# Patient Record
Sex: Male | Born: 1965 | Race: White | Hispanic: No | Marital: Married | Smoking: Never smoker
Health system: Southern US, Community
[De-identification: ages and names within clinical notes are randomized; demographics above are authoritative.]

## PROBLEM LIST (undated history)

## (undated) DIAGNOSIS — K219 Gastro-esophageal reflux disease without esophagitis: Secondary | ICD-10-CM

## (undated) DIAGNOSIS — R0602 Shortness of breath: Secondary | ICD-10-CM

## (undated) DIAGNOSIS — T733XXA Exhaustion due to excessive exertion, initial encounter: Secondary | ICD-10-CM

## (undated) HISTORY — PX: ANKLE SURGERY: SHX546

## (undated) HISTORY — DX: Gastro-esophageal reflux disease without esophagitis: K21.9

## (undated) HISTORY — DX: Exhaustion due to excessive exertion, initial encounter: T73.3XXA

## (undated) HISTORY — DX: Shortness of breath: R06.02

## (undated) HISTORY — PX: VASECTOMY: SHX75

---

## 2013-04-12 ENCOUNTER — Encounter (HOSPITAL_COMMUNITY): Payer: Self-pay | Admitting: Emergency Medicine

## 2013-04-12 ENCOUNTER — Emergency Department (HOSPITAL_COMMUNITY)
Admission: EM | Admit: 2013-04-12 | Discharge: 2013-04-12 | Disposition: A | Discharge status: Home / Self Care | Payer: PRIVATE HEALTH INSURANCE | Source: Home / Self Care

## 2013-04-12 DIAGNOSIS — R350 Frequency of micturition: Secondary | ICD-10-CM

## 2013-04-12 DIAGNOSIS — N419 Inflammatory disease of prostate, unspecified: Secondary | ICD-10-CM

## 2013-04-12 LAB — POCT URINALYSIS DIP (DEVICE)
Glucose, UA: NEGATIVE mg/dL
Ketones, ur: NEGATIVE mg/dL
Specific Gravity, Urine: >=1.03 (ref 1.005–1.030)
Urobilinogen, UA: 0.2 mg/dL (ref 0.0–1.0)

## 2013-04-12 LAB — HEMOGLOBIN A1C: Mean Plasma Glucose: 117 mg/dL — ABNORMAL HIGH (ref <117)

## 2013-04-12 LAB — GLUCOSE, CAPILLARY: Glucose-Capillary: 97 mg/dL (ref 70–99)

## 2013-04-12 MED ORDER — DOXYCYCLINE HYCLATE 100 MG PO CAPS: 100.0000 mg | ORAL_CAPSULE | Freq: Two times a day (BID) | ORAL | Status: DC

## 2013-04-12 NOTE — ED Provider Notes (Signed)
CSN: [id number]     Arrival date & time 04/12/13  0840 History   First MD Initiated Contact with Patient 04/12/13 0932     Chief Complaint  Patient presents with  . 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 He was able to initiate a trial of Cipro to which she started 8 days ago in addition to Flomax. During the day he urinates approximately once per hour with a normal state he did not has a strain. During the evening and night he will have to  Void proximately 12 times. there is some difference in the characteristic of the stream however, there is hesitancy, poor strain in the perceived necessity to strain to void his bladder. He denies urethral pain or dysuria. Denies fever, chills, abdominal pain, nausea, vomiting.    History reviewed. No pertinent past medical history. Past Surgical History  Procedure Laterality Date  . Ankle surgery     No family history on file. History  Substance Use Topics  . Smoking status: Never Smoker   . Smokeless tobacco: Not on file  . Alcohol Use: Yes    Review of Systems  Constitutional: Positive for fatigue. Negative for fever and activity change.  HENT: Negative.   Respiratory: Negative.   Gastrointestinal: Negative.   Genitourinary: Positive for urgency, frequency and difficulty urinating. Negative for dysuria, hematuria, flank pain, decreased urine volume, discharge, penile swelling, scrotal swelling, enuresis, penile pain and testicular pain.  Musculoskeletal: Negative.   Skin: Negative.   Neurological: Negative.     Allergies  Penicillins  Home Medications   Current Outpatient Rx  Name  Route  Sig  Dispense  Refill  . Ciprofloxacin (CIPRO PO)   Oral   Take by mouth.         . Phenazopyridine HCl (PYRIDIUM PO)  Oral   Take by mouth.         . tamsulosin (FLOMAX) 0.4 MG CAPS capsule   Oral   Take by mouth.         . doxycycline (VIBRAMYCIN) 100 MG capsule   Oral   Take 1 capsule (100 mg total) by mouth 2 (two) times daily.   28 capsule   0    BP 151/99  Pulse 71  Temp(Src) 98.1 F (36.7 C) (Oral)  Resp 18  SpO2 98% Physical Exam  Nursing note and vitals reviewed. Constitutional: He is oriented to person, place, and time. He appears well-developed and well-nourished. No distress.  Eyes: EOM are normal.  Neck: Normal range of motion. Neck supple.  Cardiovascular: Normal rate.   Pulmonary/Chest: Effort normal. No respiratory distress.  Genitourinary:  Prostate exam. Normal anus and sphincter tone. No lesions. Prostate is of normal size with normal sulcus. Palpation reveals a soft and boggy prostate. And also produces strong sensation to urinate as well as lower urinary tract/bladder discomfort. Denies actual pain.  Musculoskeletal: He exhibits no edema.  Neurological: He is alert and oriented to person, place, and time. He exhibits normal muscle tone.  Skin: Skin is warm and dry. No rash noted.  Psychiatric: He has a normal mood and affect.    ED Course  Procedures (including critical care time) Labs Review Labs Reviewed  POCT URINALYSIS DIP (DEVICE) - Abnormal; Notable for the following:    Hgb urine dipstick TRACE (*)    All other components within normal  limits  URINE CULTURE  HEMOGLOBIN A1C   Imaging Review No results found.  MDM   1. Prostatitis   2. Urinary frequency    Doxycycline 100 mg twice a day to the Cipro. He will complete the Cipro for 3 weeks. Continue the Flomax it seemed to help. We will culture a new urine Patient is requesting an A1c and a BNP. were drawn these in preparation for his internal medicine visit coming up in one to 2 weeks.    Hayden Rasmussen, NP 04/12/13 1026

## 2013-04-12 NOTE — ED Notes (Signed)
Not ready for discharge, labs pending

## 2013-04-12 NOTE — ED Provider Notes (Signed)
Medical screening examination/treatment/procedure(s) were performed by a resident physician or non-physician practitioner and as the supervising physician I was immediately available for consultation/collaboration.  Lemya Greenwell, MD   Liliana Brentlinger S Bernhardt Riemenschneider, MD 04/12/13 1658 

## 2013-04-12 NOTE — ED Notes (Signed)
Reports symptoms for 3 weeks, reports urinary frequency.  Patient has been on cipro for 10 days, flomax for one week and is taking pyridium.  Patient reports no improvement

## 2013-04-13 LAB — URINE CULTURE
Colony Count: NO GROWTH
Culture: NO GROWTH

## 2013-04-14 ENCOUNTER — Telehealth (HOSPITAL_COMMUNITY): Payer: Self-pay | Admitting: *Deleted

## 2013-04-14 NOTE — ED Notes (Signed)
Urine culture: No growth, Hgb A1C 5.7 H, mean glucose 117.  I asked Lawrence Lopez if I needed to call the results. He said yes.  I called pt. Pt. verified x 2 and given results.  Pt. asked if we did an I stat 8 and I said no.  He asked the range on AIC. I told him it just says <5.7 % is normal, but I have been told 6.5 and above is considered diabetes.   Lawrence Lopez 04/14/2013

## 2015-12-06 DIAGNOSIS — T733XXA Exhaustion due to excessive exertion, initial encounter: Secondary | ICD-10-CM | POA: Insufficient documentation

## 2015-12-06 DIAGNOSIS — R0602 Shortness of breath: Secondary | ICD-10-CM | POA: Insufficient documentation

## 2015-12-06 DIAGNOSIS — K219 Gastro-esophageal reflux disease without esophagitis: Secondary | ICD-10-CM | POA: Insufficient documentation

## 2015-12-09 ENCOUNTER — Encounter: Payer: Self-pay | Admitting: Cardiovascular Disease

## 2015-12-09 ENCOUNTER — Ambulatory Visit (INDEPENDENT_AMBULATORY_CARE_PROVIDER_SITE_OTHER): Payer: PRIVATE HEALTH INSURANCE | Admitting: Cardiovascular Disease

## 2015-12-09 VITALS — BP 130/88 | HR 72 | Ht 71.0 in | Wt 214.4 lb

## 2015-12-09 DIAGNOSIS — R0602 Shortness of breath: Secondary | ICD-10-CM

## 2015-12-09 NOTE — Progress Notes (Signed)
Cardiology Office Note Date:  12/09/2015   ID:  Lawrence Lopez, DOB [birth year], MRN [id number]  PCP:  Lawrence Ran, MD Cardiologist:  Lawrence Bollman, MD    Chief Complaint  Patient presents with  . New Patient (Initial Visit)    worsening DOE    History of Present Illness: Lawrence Lopez is a 50 y.o. male who presents for evaluation of shortness of breath. 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 He has been active and healthy over the years, but over the last few months he has been limited by shortness of breath. He has previously been able to exercise without limitation. He initially felt he was 'out of shape' and attributed symptoms to weight gain. However, even with regular exercise and weight back to baseline, he continues to have dyspnea with moderate exertion such as jogging at a slow pace. He denies resting symptoms and also denies orthopnea, PND, or edema. He's had no chest pain or pressure. Denies wheezing, cough, or reflux symptoms. No family history of premature CAD. He has a history of HTN, previously controlled with exercise and diet, but more recently has required multidrug antihypertensive Rx.    Past Medical History  Diagnosis Date  . SOB (shortness of breath) on exertion   . Fatigue due to excessive exertion   . GERD (gastroesophageal reflux disease)     Past Surgical History  Procedure Laterality Date  . Ankle surgery    . Vasectomy      AGE 50    Current Outpatient Prescriptions  Medication Sig Dispense Refill  . amLODipine (NORVASC) 10 MG tablet Take 10 mg by mouth daily.  3  . atorvastatin (LIPITOR) 80 MG tablet Take 40 mg by mouth daily.  4  . chlorthalidone (HYGROTON) 25 MG tablet Take 25 mg by mouth daily.    . valsartan (DIOVAN) 320 MG tablet Take 320 mg by mouth daily.  2   No current facility-administered medications for this visit.    Allergies:   Penicillins   Social History:  The patient  reports that he has never smoked. He does not  have any smokeless tobacco history on file. He reports that he drinks alcohol. He reports that he does not use illicit drugs.   Family History:  The patient's  family history includes Alcoholism in his brother; COPD in his paternal grandfather and paternal grandmother; Cancer - Lung in his paternal grandfather and paternal grandmother; Cancer - Ovarian in his mother; Diabetes type II in his brother and father; Heart attack (age of onset: 56) in his maternal aunt; Heart failure in his maternal grandmother; Hypertension in his brother, brother, father, maternal grandmother, and mother; Kidney disease in his maternal grandmother; Renal Disease in his mother; Squamous cell carcinoma (age of onset: 90) in his father.    ROS:  Please see the history of present illness.   All other systems are reviewed and negative.    PHYSICAL EXAM: VS:  BP 130/88 mmHg  Pulse 72  Ht  (1.803 m)  Wt 214 lb 6.4 oz (97.251 kg)  BMI 29.92 kg/m2 , BMI Body mass index is 29.92 kg/(m^2). GEN: Well nourished, well developed, in no acute distress HEENT: normal Neck: no JVD, no masses. No carotid bruits Cardiac: RRR without murmur or gallop                Respiratory:  clear to auscultation bilaterally, normal work of breathing GI: soft, nontender, nondistended, + BS MS: no deformity or  atrophy Ext: no pretibial edema, pedal pulses 2+= bilaterally Skin: warm and dry, no rash Neuro:  Strength and sensation are intact Psych: euthymic mood, full affect  EKG:  EKG is not ordered today. [facility] and shows NSR with possible age-indeterminate inferior MI likely normal variant  Recent Labs: No results found for requested labs within last 365 days.   Lipid Panel  No results found for: CHOL, TRIG, HDL, CHOLHDL, VLDL, LDLCALC, LDLDIRECT    Wt Readings from Last 3 Encounters:  12/09/15 214 lb 6.4 oz (97.251 kg)    ASSESSMENT AND PLAN: 50 yo male with hypertension, now presenting with  progressive exertional dyspnea, NYHA II symptoms.  He has been previously active and no history of cardiac problems. He has a normal cardiac exam. Considering his history of hypertension, I've recommended a 2D echo to evaluate for the possibility of systolic or diastolic dysfunction. I've also recommended an exercise Myoview stress test to evaluate dyspnea as an anginal equivalent. The patient's recent lab and radiographic studies are reviewed with a normal d-dimer, BNP, and chest XRay. Will assess exercise capacity, myocardial perfusion, systolic and diastolic parameters, and valvular function with the echo and nuclear scans. Further testing only if significant abnormalities found.   Current medicines are reviewed with the patient today.  The patient does not have concerns regarding medicines.  Labs/ tests ordered today include:  No orders of the defined types were placed in this encounter.   Disposition:   FU pending results of testing  Signed, Lawrence Bollmanooper, Kamau Weatherall, MD  12/09/2015 12:19 PM    [facility] [address], [place], [place]  [address] Phone: [phone]; Fax: [phone]

## 2015-12-09 NOTE — Patient Instructions (Signed)
Medication Instructions:  Your physician recommends that you continue on your current medications as directed. Please refer to the Current Medication list given to you today.  Labwork: No new orders.   Testing/Procedures: Your physician has requested that you have an echocardiogram. Echocardiography is a painless test that uses sound waves to create images of your heart. It provides your doctor with information about the size and shape of your heart and how well your heart's chambers and valves are working. This procedure takes approximately one hour. There are no restrictions for this procedure.  Your physician has requested that you have an exercise stress myoview. For further information please visit [email or web address]. Please follow instruction sheet, as given.  Follow-Up: Your physician recommends that you schedule a follow-up appointment as needed with Dr Cooper.    Any Other Special Instructions Will Be Listed Below (If Applicable).     If you need a refill on your cardiac medications before your next appointment, please call your pharmacy.   

## 2015-12-16 ENCOUNTER — Telehealth (HOSPITAL_COMMUNITY): Payer: Self-pay | Admitting: *Deleted

## 2015-12-16 ENCOUNTER — Telehealth (HOSPITAL_COMMUNITY): Payer: Self-pay | Admitting: Radiology

## 2015-12-16 NOTE — Telephone Encounter (Signed)
Patient given detailed instructions per Myocardial Perfusion Study Information Sheet for the test on 12/19/15 at 7:45. Patient notified to arrive 15 minutes early and that it is imperative to arrive on time for appointment to keep from having the test rescheduled.  If you need to cancel or reschedule your appointment, please call the office within 24 hours of your appointment. Failure to do so may result in a cancellation of your appointment, and a $50 no show fee. Patient verbalized understanding.Harlow Asa.Elizabeth Young

## 2015-12-16 NOTE — Telephone Encounter (Signed)
Left message on voicemail in reference to upcoming appointment scheduled for 12/19/15. Phone number given for a call back so details instructions can be given. Loany Neuroth J Pricilla Moehle, RN  

## 2015-12-19 ENCOUNTER — Ambulatory Visit (HOSPITAL_COMMUNITY): Payer: PRIVATE HEALTH INSURANCE | Attending: Cardiovascular Disease

## 2015-12-19 ENCOUNTER — Ambulatory Visit (HOSPITAL_BASED_OUTPATIENT_CLINIC_OR_DEPARTMENT_OTHER): Payer: PRIVATE HEALTH INSURANCE

## 2015-12-19 ENCOUNTER — Other Ambulatory Visit: Payer: Self-pay

## 2015-12-19 DIAGNOSIS — Z8249 Family history of ischemic heart disease and other diseases of the circulatory system: Secondary | ICD-10-CM | POA: Diagnosis not present

## 2015-12-19 DIAGNOSIS — R0602 Shortness of breath: Secondary | ICD-10-CM

## 2015-12-19 DIAGNOSIS — I119 Hypertensive heart disease without heart failure: Secondary | ICD-10-CM | POA: Diagnosis not present

## 2015-12-19 DIAGNOSIS — I071 Rheumatic tricuspid insufficiency: Secondary | ICD-10-CM | POA: Diagnosis not present

## 2015-12-19 DIAGNOSIS — R0609 Other forms of dyspnea: Secondary | ICD-10-CM | POA: Insufficient documentation

## 2015-12-19 DIAGNOSIS — R5383 Other fatigue: Secondary | ICD-10-CM | POA: Diagnosis not present

## 2015-12-19 DIAGNOSIS — R0600 Dyspnea, unspecified: Secondary | ICD-10-CM | POA: Diagnosis present

## 2015-12-19 DIAGNOSIS — I340 Nonrheumatic mitral (valve) insufficiency: Secondary | ICD-10-CM | POA: Diagnosis not present

## 2015-12-19 DIAGNOSIS — R9439 Abnormal result of other cardiovascular function study: Secondary | ICD-10-CM | POA: Insufficient documentation

## 2015-12-19 LAB — MYOCARDIAL PERFUSION IMAGING
CHL CUP NUCLEAR SDS: 4
CHL CUP NUCLEAR SRS: 1
CHL CUP NUCLEAR SSS: 5
CSEPED: 14 min
Estimated workload: 17.2 METS
Exercise duration (sec): 0 s
LV dias vol: 118 mL (ref 62–150)
LV sys vol: 53 mL
MPHR: 171 {beats}/min
Peak HR: 155 {beats}/min
Percent HR: 90 %
RATE: 0.29
Rest HR: 60 {beats}/min
TID: 0.85

## 2015-12-19 MED ORDER — TECHNETIUM TC 99M SESTAMIBI GENERIC - CARDIOLITE
10.6000 | Freq: Once | INTRAVENOUS | Status: AC | PRN
  Administered 2015-12-19: 11 via INTRAVENOUS

## 2015-12-19 MED ORDER — TECHNETIUM TC 99M SESTAMIBI GENERIC - CARDIOLITE
32.8000 | Freq: Once | INTRAVENOUS | Status: AC | PRN
  Administered 2015-12-19: 32.8 via INTRAVENOUS

## 2016-06-30 ENCOUNTER — Encounter: Payer: Self-pay | Admitting: Internal Medicine

## 2016-09-10 ENCOUNTER — Ambulatory Visit (HOSPITAL_BASED_OUTPATIENT_CLINIC_OR_DEPARTMENT_OTHER)
Admission: RE | Admit: 2016-09-10 | Discharge: 2016-09-10 | Disposition: A | Discharge status: Home / Self Care | Payer: PRIVATE HEALTH INSURANCE | Source: Ambulatory Visit | Attending: Family Medicine | Admitting: Family Medicine

## 2016-09-10 ENCOUNTER — Encounter: Payer: Self-pay | Admitting: Family Medicine

## 2016-09-10 ENCOUNTER — Ambulatory Visit (INDEPENDENT_AMBULATORY_CARE_PROVIDER_SITE_OTHER): Payer: PRIVATE HEALTH INSURANCE | Admitting: Family Medicine

## 2016-09-10 VITALS — BP 126/75 | HR 67 | Ht 71.0 in | Wt 215.0 lb

## 2016-09-10 DIAGNOSIS — M1711 Unilateral primary osteoarthritis, right knee: Secondary | ICD-10-CM | POA: Diagnosis not present

## 2016-09-10 DIAGNOSIS — M25561 Pain in right knee: Secondary | ICD-10-CM

## 2016-09-10 NOTE — Patient Instructions (Signed)
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 Use the voltaren gel up to 4 times a day over the medial joint line. Icing as needed 15 minutes at a time 3-4 times a day. Compression sleeve as needed for swelling. Straight leg raises, leg raises with foot turned outwards, knee extensions 3 sets of 10 once a day. Avoid deep squats, deep lunges, leg press. Avoid cutting, pivoting maneuvers and sports. Call me if you want to try formal physical therapy or a cortisone injection. Follow up with me as needed otherwise.

## 2016-09-14 DIAGNOSIS — M25561 Pain in right knee: Secondary | ICD-10-CM | POA: Insufficient documentation

## 2016-09-14 NOTE — Assessment & Plan Note (Signed)
independently reviewed radiographs and minimal evidence DJD.  Consistent with degenerative medial meniscus tear with small effusion.  Will start with conservative treatment - voltaren gel, icing, elevation, compression.  Consider PT, injection.  Avoid deep squats, lunges, leg press, cutting.  F/u prn otherwise.

## 2016-09-14 NOTE — Progress Notes (Signed)
PCP: Ezequiel KayserPERINI,Kato A, MD  Subjective:   HPI: Patient is a 51 y.o. male here for right knee pain.  Patient reports he's developed medial right knee pain over past month. No known injury or trauma. Pain up to 3/10, dull. Felt he had pes bursitis - wife is PT so has been doing rehab at home for this. Some associated swelling. Wears knee sleeve at work. Knee felt unstable then. Has voltaren gel but hasn't started yet. No skin changes, numbness.  Past Medical History:  Diagnosis Date  . Fatigue due to excessive exertion   . GERD (gastroesophageal reflux disease)   . SOB (shortness of breath) on exertion     Current Outpatient Prescriptions on File Prior to Visit  Medication Sig Dispense Refill  . amLODipine (NORVASC) 10 MG tablet Take 10 mg by mouth daily.  3  . atorvastatin (LIPITOR) 80 MG tablet Take 40 mg by mouth daily.  4  . chlorthalidone (HYGROTON) 25 MG tablet Take 25 mg by mouth daily.    . valsartan (DIOVAN) 320 MG tablet Take 320 mg by mouth daily.  2   No current facility-administered medications on file prior to visit.     Past Surgical History:  Procedure Laterality Date  . ANKLE SURGERY    . VASECTOMY     AGE 51    Allergies  Allergen Reactions  . Penicillins Other (See Comments)    Childhood reaction. Per mother "turned blue"    Social History   Social History  . Marital status: Married    Spouse name: N/A  . Number of children: N/A  . Years of education: N/A   Occupational History  . Not on file.   Social History Main Topics  . Smoking status: Never Smoker  . Smokeless tobacco: Never Used  . Alcohol use Yes  . Drug use: No  . Sexual activity: Yes     Comment: WIFE NANCY 1993   Other Topics Concern  . Not on file   Social History Narrative  . No narrative on file    Family History  Problem Relation Age of Onset  . Cancer - Ovarian Mother   . Hypertension Mother   . Renal Disease Mother   . Hypertension Father   . Diabetes type II  Father   . Squamous cell carcinoma Father 473    IN THROAT  . Hypertension Brother   . Diabetes type II Brother   . Heart attack Maternal Aunt 50  . Hypertension Maternal Grandmother   . Heart failure Maternal Grandmother   . Kidney disease Maternal Grandmother   . COPD Paternal Grandmother   . Cancer - Lung Paternal Grandmother   . COPD Paternal Grandfather   . Cancer - Lung Paternal Grandfather   . Hypertension Brother   . Alcoholism Brother     BP 126/75   Pulse 67   Ht 5\' 11"  (1.803 m)   Wt 215 lb (97.5 kg)   BMI 29.99 kg/m   Review of Systems: See HPI above.     Objective:  Physical Exam:  Gen: NAD, comfortable in exam room  Right knee: Mild effusion.  No other deformity, bruising. TTP medial joint line. FROM. Negative ant/post drawers. Negative valgus/varus testing. Negative lachmanns. Positive mcmurrays, apleys, thessalys.  Negative sit home, patellar apprehension. NV intact distally.  Left knee: FROM without pain.   Assessment & Plan:  1. Right knee pain - independently reviewed radiographs and minimal evidence DJD.  Consistent with degenerative medial meniscus  tear with small effusion.  Will start with conservative treatment - voltaren gel, icing, elevation, compression.  Consider PT, injection.  Avoid deep squats, lunges, leg press, cutting.  F/u prn otherwise.

## 2016-09-29 ENCOUNTER — Other Ambulatory Visit: Payer: Self-pay | Admitting: Orthopedic Surgery

## 2016-09-29 DIAGNOSIS — R52 Pain, unspecified: Secondary | ICD-10-CM

## 2016-10-08 ENCOUNTER — Ambulatory Visit
Admission: RE | Admit: 2016-10-08 | Discharge: 2016-10-08 | Disposition: A | Discharge status: Home / Self Care | Payer: PRIVATE HEALTH INSURANCE | Source: Ambulatory Visit | Attending: Orthopedic Surgery | Admitting: Orthopedic Surgery

## 2016-10-08 DIAGNOSIS — R52 Pain, unspecified: Secondary | ICD-10-CM

## 2017-12-27 IMAGING — NM NM MISC PROCEDURE
6 series · 36 of 36 positions shown · non-contrast
Comparison: none

[Series 1: wbr_r-proj_st rest · 6.51mm/px · 6 of 64 frames shown]
[frame 6/64]
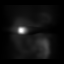
[frame 16/64]
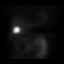
[frame 27/64]
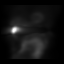
[frame 38/64]
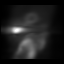
[frame 48/64]
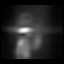
[frame 59/64]
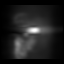

[Series 1: rest · 6.51mm/px · 6 of 64 frames shown]
[frame 6/64]
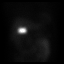
[frame 16/64]
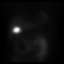
[frame 27/64]
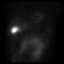
[frame 38/64]
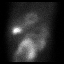
[frame 48/64]
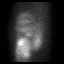
[frame 59/64]
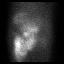

[Series 2: wbr_s-proj_st stress · 6.51mm/px · 6 of 64 frames shown (1 of 2)]
[frame 6/64]
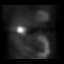
[frame 16/64]
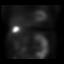
[frame 27/64]
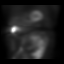
[frame 38/64]
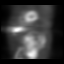
[frame 48/64]
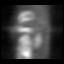
[frame 59/64]
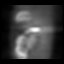

[Series 2: wbr_s-proj_st stress · 6.51mm/px · 6 of 511 frames shown (2 of 2)]
[frame 43/511]
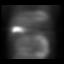
[frame 128/511]
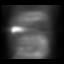
[frame 213/511]
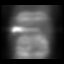
[frame 298/511]
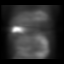
[frame 383/511]
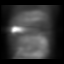
[frame 469/511]
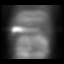

[Series 2: stress · 6.51mm/px · 6 of 512 frames shown (1 of 2)]
[frame 43/512]
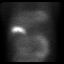
[frame 128/512]
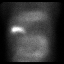
[frame 214/512]
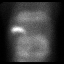
[frame 299/512]
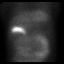
[frame 384/512]
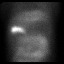
[frame 470/512]
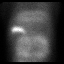

[Series 2: stress · 6.51mm/px · 6 of 64 frames shown (2 of 2)]
[frame 6/64]
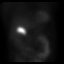
[frame 16/64]
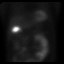
[frame 27/64]
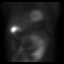
[frame 38/64]
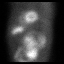
[frame 48/64]
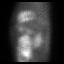
[frame 59/64]
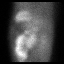

[36 of 36 positions shown; findings below may reference images not displayed]

Canned report from images found in remote index.

Refer to host system for actual result text.

## 2018-09-19 IMAGING — DX DG KNEE AP/LAT W/ SUNRISE*R*
3 series · 3 of 3 positions shown · non-contrast
Comparison: None.

CLINICAL DATA: Right medial knee pain for 4 weeks, no injury

EXAM:
RIGHT KNEE 3 VIEWS

[knee ap]
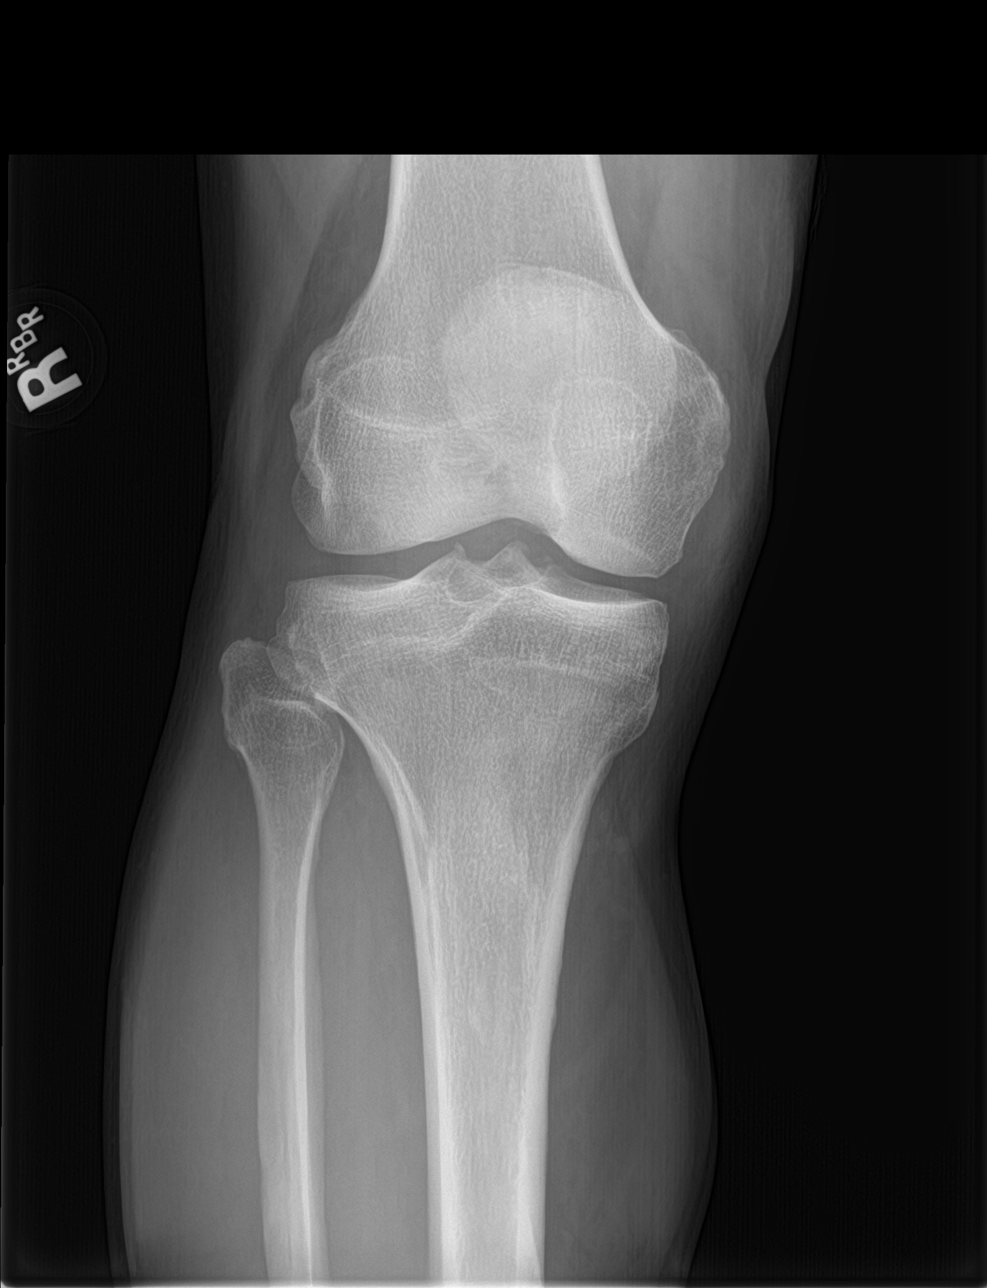

[knee lat]
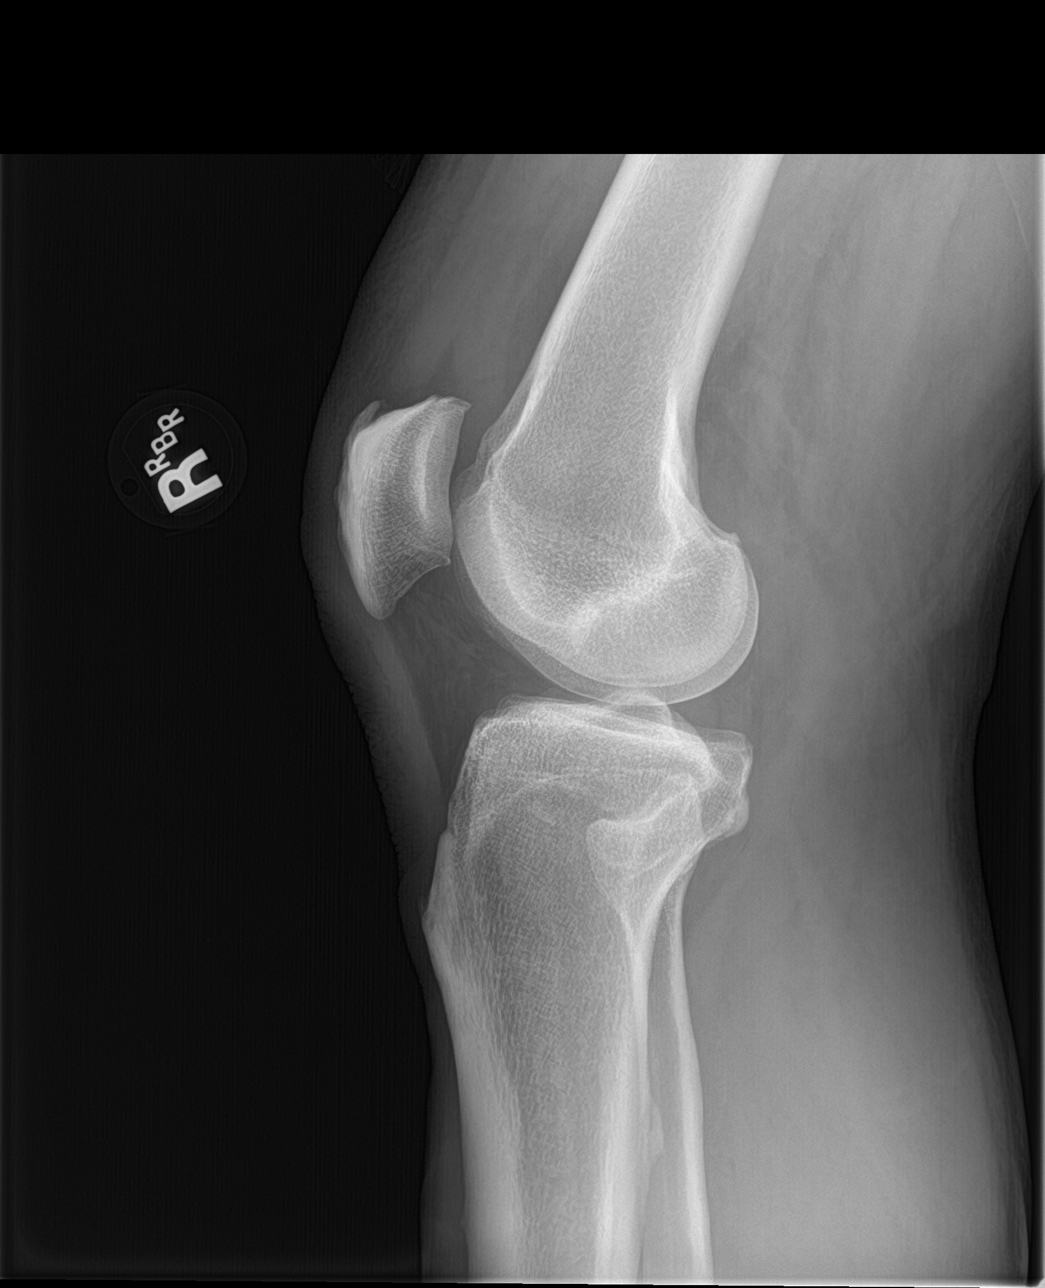

[knee sunrise]
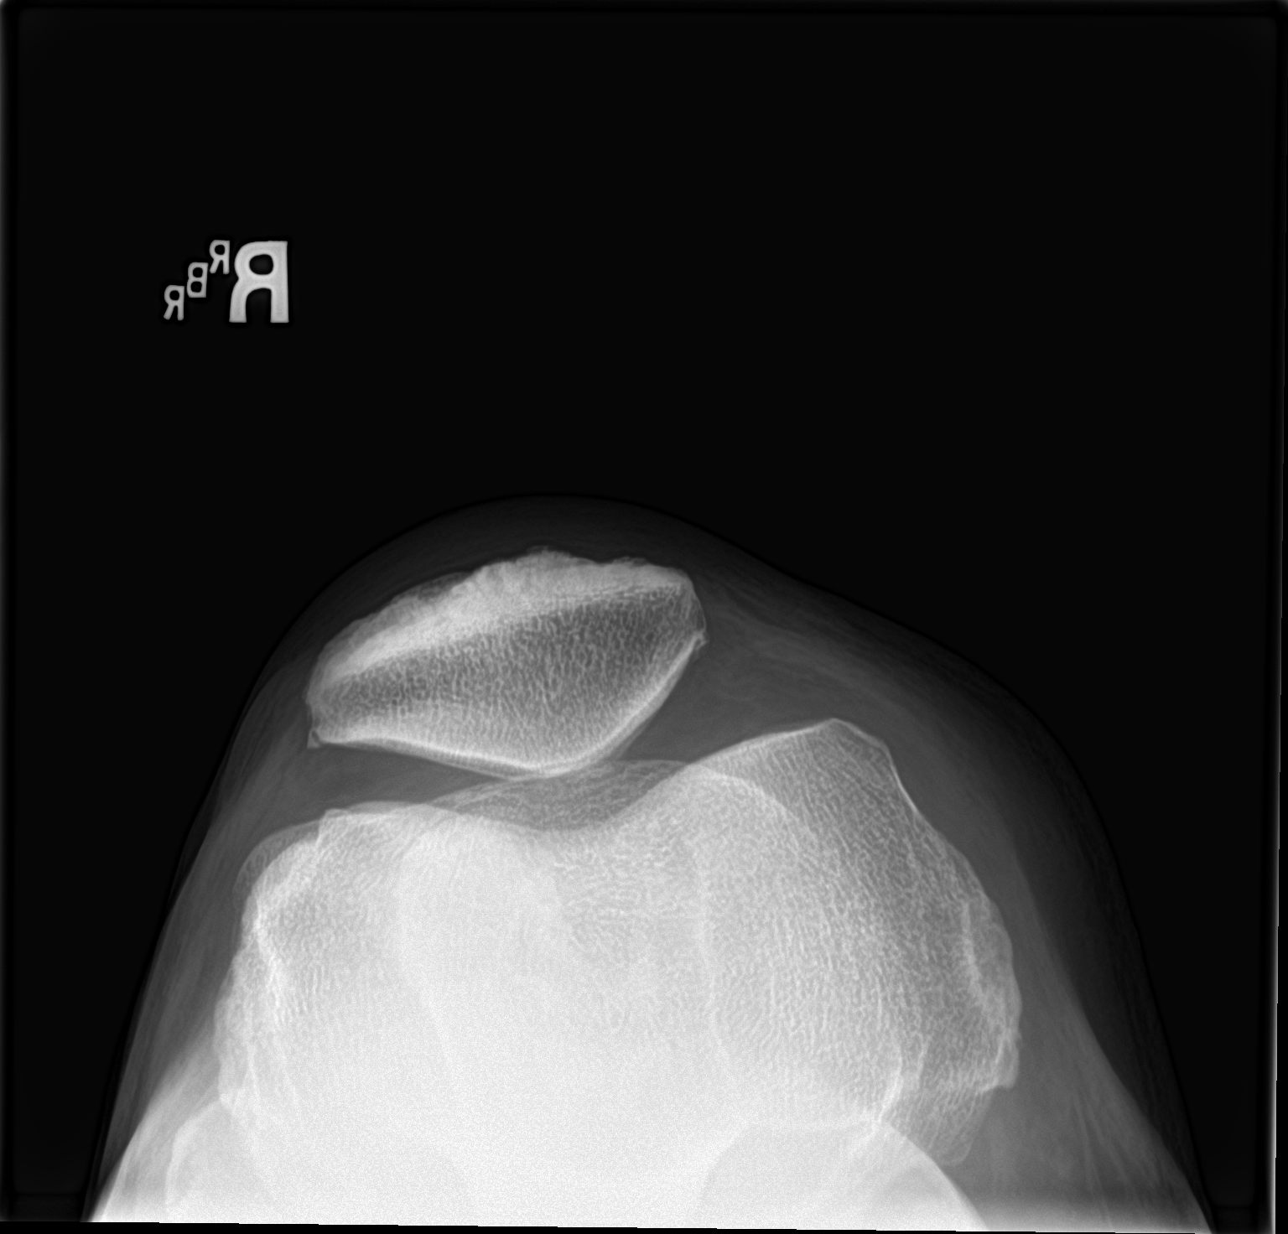

[3 of 3 positions shown; findings below may reference images not displayed]

FINDINGS: There is very minimal degenerative spurring from the patellofemoral
articulation but the medial and lateral compartments appear well
preserved. There does appear to be a tiny amount of right knee joint
fluid present. No fracture is seen.
IMPRESSION: 1. Minimal degenerative change involving the patellofemoral
articulation.
2. Tiny amount of right knee joint fluid.
